# Patient Record
Sex: Male | Born: 1985
Health system: Southern US, Community
[De-identification: ages and names within clinical notes are randomized; demographics above are authoritative.]

---
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
## 2006-01-07 ENCOUNTER — Encounter: Admission: RE | Admit: 2006-01-07 | Discharge: 2006-01-07 | Payer: Self-pay | Admitting: Gastroenterology

## 2007-12-31 ENCOUNTER — Encounter: Admission: RE | Admit: 2007-12-31 | Discharge: 2007-12-31 | Payer: Self-pay | Admitting: Family Medicine

## 2013-07-14 ENCOUNTER — Other Ambulatory Visit: Payer: Self-pay | Admitting: Family Medicine

## 2013-07-14 ENCOUNTER — Ambulatory Visit
Admission: RE | Admit: 2013-07-14 | Discharge: 2013-07-14 | Disposition: A | Discharge status: Home / Self Care | Payer: BC Managed Care – PPO | Source: Ambulatory Visit | Attending: Family Medicine | Admitting: Family Medicine

## 2013-07-14 DIAGNOSIS — M25551 Pain in right hip: Secondary | ICD-10-CM

## 2015-10-06 DIAGNOSIS — Z23 Encounter for immunization: Secondary | ICD-10-CM | POA: Diagnosis not present

## 2016-01-26 IMAGING — CR DG HIP COMPLETE 2+V*R*
2 series · 2 of 2 positions shown · non-contrast
Comparison: None.

CLINICAL DATA: Right hip pain, patient runs

EXAM:
RIGHT HIP - COMPLETE 2+ VIEW

[t hip ap right]
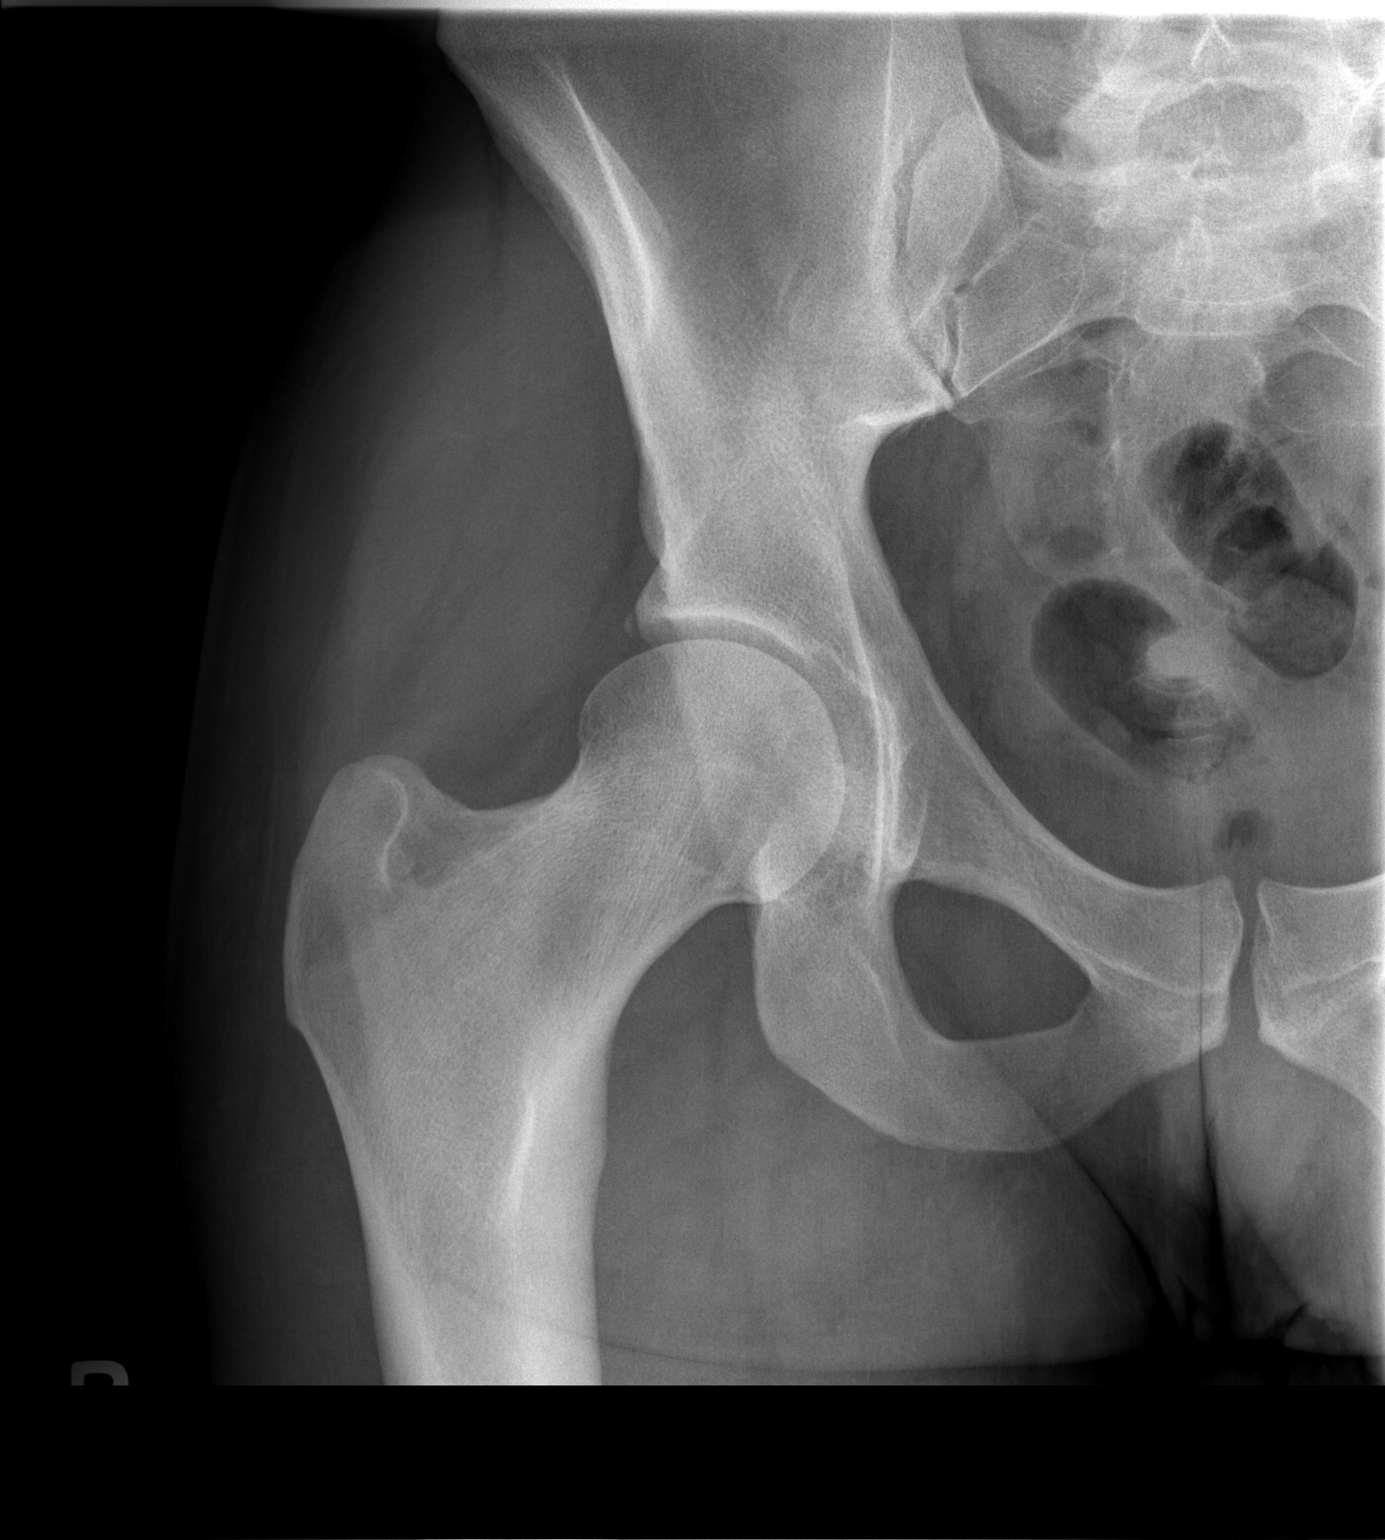

[t hip frog leg right]
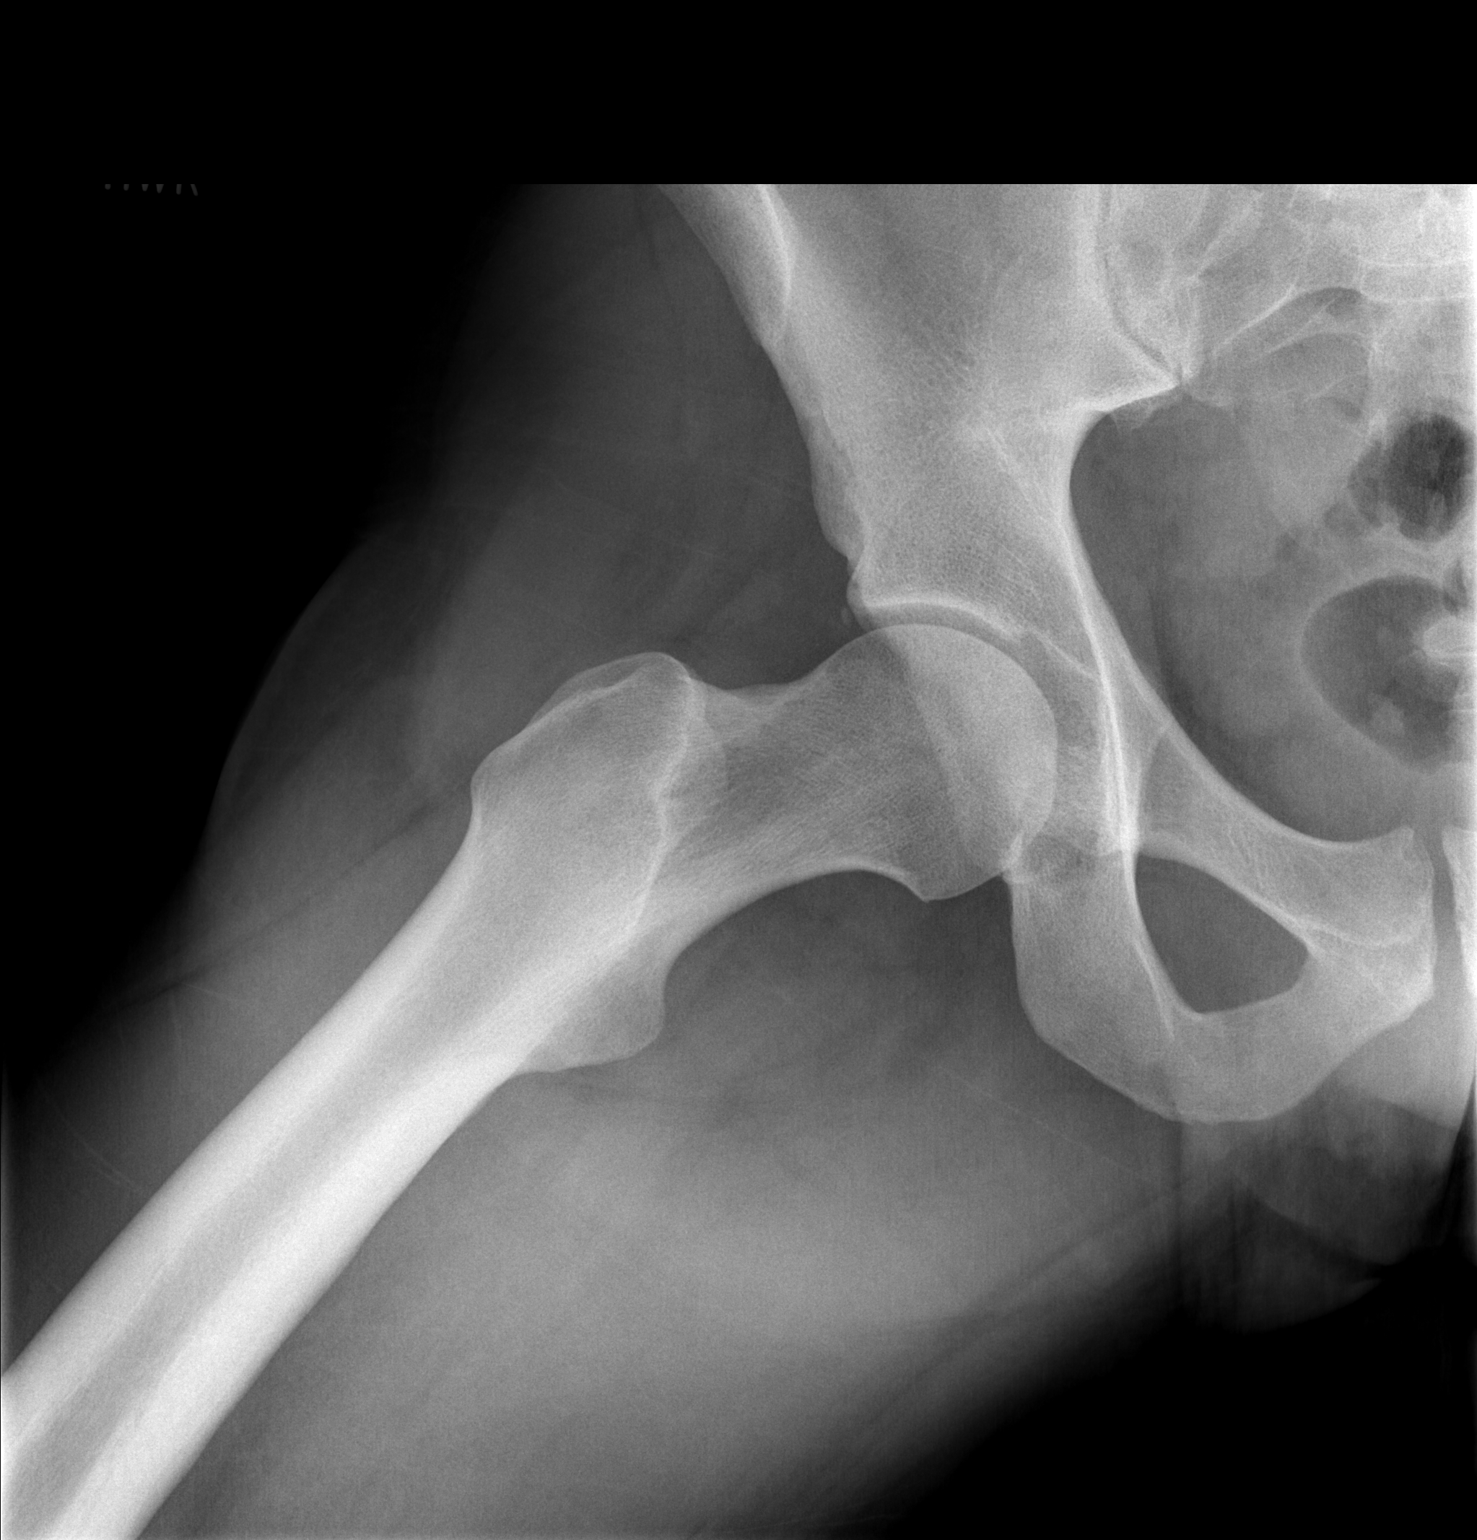

[2 of 2 positions shown; findings below may reference images not displayed]

FINDINGS: The right hip joint space is well preserved. No fracture is seen. No
degenerative change is noted. The right SI joint is corticated. The
right pelvic ramus appears normal.
IMPRESSION: Negative.

## 2016-10-30 DIAGNOSIS — Z23 Encounter for immunization: Secondary | ICD-10-CM | POA: Diagnosis not present

## 2017-03-06 DIAGNOSIS — L700 Acne vulgaris: Secondary | ICD-10-CM | POA: Diagnosis not present

## 2017-04-02 DIAGNOSIS — L0201 Cutaneous abscess of face: Secondary | ICD-10-CM | POA: Diagnosis not present

## 2017-04-11 DIAGNOSIS — L910 Hypertrophic scar: Secondary | ICD-10-CM | POA: Diagnosis not present

## 2017-04-11 DIAGNOSIS — L0291 Cutaneous abscess, unspecified: Secondary | ICD-10-CM | POA: Diagnosis not present

## 2017-11-21 DIAGNOSIS — M25559 Pain in unspecified hip: Secondary | ICD-10-CM | POA: Diagnosis not present

## 2017-11-21 DIAGNOSIS — M25562 Pain in left knee: Secondary | ICD-10-CM | POA: Diagnosis not present

## 2017-11-21 DIAGNOSIS — Z0000 Encounter for general adult medical examination without abnormal findings: Secondary | ICD-10-CM | POA: Diagnosis not present

## 2017-11-26 DIAGNOSIS — M25562 Pain in left knee: Secondary | ICD-10-CM | POA: Diagnosis not present

## 2017-11-26 DIAGNOSIS — M25551 Pain in right hip: Secondary | ICD-10-CM | POA: Diagnosis not present

## 2017-12-04 DIAGNOSIS — Z13220 Encounter for screening for lipoid disorders: Secondary | ICD-10-CM | POA: Diagnosis not present

## 2017-12-04 DIAGNOSIS — Z0000 Encounter for general adult medical examination without abnormal findings: Secondary | ICD-10-CM | POA: Diagnosis not present

## 2017-12-12 DIAGNOSIS — M25551 Pain in right hip: Secondary | ICD-10-CM | POA: Diagnosis not present

## 2017-12-12 DIAGNOSIS — M25562 Pain in left knee: Secondary | ICD-10-CM | POA: Diagnosis not present

## 2018-03-24 DIAGNOSIS — J029 Acute pharyngitis, unspecified: Secondary | ICD-10-CM | POA: Diagnosis not present

## 2018-08-07 DIAGNOSIS — Z20828 Contact with and (suspected) exposure to other viral communicable diseases: Secondary | ICD-10-CM | POA: Diagnosis not present

## 2018-11-20 DIAGNOSIS — Z20828 Contact with and (suspected) exposure to other viral communicable diseases: Secondary | ICD-10-CM | POA: Diagnosis not present

## 2019-08-17 DIAGNOSIS — Z3141 Encounter for fertility testing: Secondary | ICD-10-CM | POA: Diagnosis not present

## 2019-12-23 DIAGNOSIS — Z23 Encounter for immunization: Secondary | ICD-10-CM | POA: Diagnosis not present

## 2019-12-23 DIAGNOSIS — M25572 Pain in left ankle and joints of left foot: Secondary | ICD-10-CM | POA: Diagnosis not present

## 2020-01-08 ENCOUNTER — Other Ambulatory Visit: Payer: Self-pay

## 2020-01-08 ENCOUNTER — Ambulatory Visit: Payer: 59 | Admitting: Podiatry

## 2020-01-08 ENCOUNTER — Ambulatory Visit (INDEPENDENT_AMBULATORY_CARE_PROVIDER_SITE_OTHER): Payer: 59

## 2020-01-08 DIAGNOSIS — M7661 Achilles tendinitis, right leg: Secondary | ICD-10-CM | POA: Diagnosis not present

## 2020-01-08 DIAGNOSIS — M25572 Pain in left ankle and joints of left foot: Secondary | ICD-10-CM

## 2020-01-08 DIAGNOSIS — M25571 Pain in right ankle and joints of right foot: Secondary | ICD-10-CM

## 2020-01-08 MED ORDER — MELOXICAM 15 MG PO TABS: 15.0000 mg | ORAL_TABLET | Freq: Every day | ORAL | 3 refills | Status: AC

## 2020-01-08 NOTE — Patient Instructions (Addendum)
Look for Voltaren gel at the pharmacy over the counter or online (also known as diclofenac 1% gel). Apply to the painful areas 3-4x daily with the supplied dosing card. Allow to dry for 10 minutes before going into socks/shoes  Wear the boot for 2 weeks then gradually come out of it into a supportive sneaker. Do the stretches twice daily. Take the meloxicam daily for 2 weeks and then as needed   Achilles Tendinitis  with Rehab Achilles tendinitis is a disorder of the Achilles tendon. The Achilles tendon connects the large calf muscles (Gastrocnemius and Soleus) to the heel bone (calcaneus). This tendon is sometimes called the heel cord. It is important for pushing-off and standing on your toes and is important for walking, running, or jumping. Tendinitis is often caused by overuse and repetitive microtrauma. SYMPTOMS  Pain, tenderness, swelling, warmth, and redness may occur over the Achilles tendon even at rest.  Pain with pushing off, or flexing or extending the ankle.  Pain that is worsened after or during activity. CAUSES   Overuse sometimes seen with rapid increase in exercise programs or in sports requiring running and jumping.  Poor physical conditioning (strength and flexibility or endurance).  Running sports, especially training running down hills.  Inadequate warm-up before practice or play or failure to stretch before participation.  Injury to the tendon. PREVENTION   Warm up and stretch before practice or competition.  Allow time for adequate rest and recovery between practices and competition.  Keep up conditioning.  Keep up ankle and leg flexibility.  Improve or keep muscle strength and endurance.  Improve cardiovascular fitness.  Use proper technique.  Use proper equipment (shoes, skates).  To help prevent recurrence, taping, protective strapping, or an adhesive bandage may be recommended for several weeks after healing is complete. PROGNOSIS   Recovery  may take weeks to several months to heal.  Longer recovery is expected if symptoms have been prolonged.  Recovery is usually quicker if the inflammation is due to a direct blow as compared with overuse or sudden strain. RELATED COMPLICATIONS   Healing time will be prolonged if the condition is not correctly treated. The injury must be given plenty of time to heal.  Symptoms can reoccur if activity is resumed too soon.  Untreated, tendinitis may increase the risk of tendon rupture requiring additional time for recovery and possibly surgery. TREATMENT   The first treatment consists of rest anti-inflammatory medication, and ice to relieve the pain.  Stretching and strengthening exercises after resolution of pain will likely help reduce the risk of recurrence. Referral to a physical therapist or athletic trainer for further evaluation and treatment may be helpful.  A walking boot or cast may be recommended to rest the Achilles tendon. This can help break the cycle of inflammation and microtrauma.  Arch supports (orthotics) may be prescribed or recommended by your caregiver as an adjunct to therapy and rest.  Surgery to remove the inflamed tendon lining or degenerated tendon tissue is rarely necessary and has shown less than predictable results. MEDICATION   Nonsteroidal anti-inflammatory medications, such as aspirin and ibuprofen, may be used for pain and inflammation relief. Do not take within 7 days before surgery. Take these as directed by your caregiver. Contact your caregiver immediately if any bleeding, stomach upset, or signs of allergic reaction occur. Other minor pain relievers, such as acetaminophen, may also be used.  Pain relievers may be prescribed as necessary by your caregiver. Do not take prescription pain medication for  longer than 4 to 7 days. Use only as directed and only as much as you need.  Cortisone injections are rarely indicated. Cortisone injections may weaken  tendons and predispose to rupture. It is better to give the condition more time to heal than to use them. HEAT AND COLD  Cold is used to relieve pain and reduce inflammation for acute and chronic Achilles tendinitis. Cold should be applied for 10 to 15 minutes every 2 to 3 hours for inflammation and pain and immediately after any activity that aggravates your symptoms. Use ice packs or an ice massage.  Heat may be used before performing stretching and strengthening activities prescribed by your caregiver. Use a heat pack or a warm soak. SEEK MEDICAL CARE IF:  Symptoms get worse or do not improve in 2 weeks despite treatment.  New, unexplained symptoms develop. Drugs used in treatment may produce side effects.  EXERCISES:  RANGE OF MOTION (ROM) AND STRETCHING EXERCISES - Achilles Tendinitis  These exercises may help you when beginning to rehabilitate your injury. Your symptoms may resolve with or without further involvement from your physician, physical therapist or athletic trainer. While completing these exercises, remember:   Restoring tissue flexibility helps normal motion to return to the joints. This allows healthier, less painful movement and activity.  An effective stretch should be held for at least 30 seconds.  A stretch should never be painful. You should only feel a gentle lengthening or release in the stretched tissue.  STRETCH  Gastroc, Standing   Place hands on wall.  Extend right / left leg, keeping the front knee somewhat bent.  Slightly point your toes inward on your back foot.  Keeping your right / left heel on the floor and your knee straight, shift your weight toward the wall, not allowing your back to arch.  You should feel a gentle stretch in the right / left calf. Hold this position for 10 seconds. Repeat 3 times. Complete this stretch 2 times per day.  STRETCH  Soleus, Standing   Place hands on wall.  Extend right / left leg, keeping the other knee  somewhat bent.  Slightly point your toes inward on your back foot.  Keep your right / left heel on the floor, bend your back knee, and slightly shift your weight over the back leg so that you feel a gentle stretch deep in your back calf.  Hold this position for 10 seconds. Repeat 3 times. Complete this stretch 2 times per day.  STRETCH  Gastrocsoleus, Standing  Note: This exercise can place a lot of stress on your foot and ankle. Please complete this exercise only if specifically instructed by your caregiver.   Place the ball of your right / left foot on a step, keeping your other foot firmly on the same step.  Hold on to the wall or a rail for balance.  Slowly lift your other foot, allowing your body weight to press your heel down over the edge of the step.  You should feel a stretch in your right / left calf.  Hold this position for 10 seconds.  Repeat this exercise with a slight bend in your knee. Repeat 3 times. Complete this stretch 2 times per day.   STRENGTHENING EXERCISES - Achilles Tendinitis These exercises may help you when beginning to rehabilitate your injury. They may resolve your symptoms with or without further involvement from your physician, physical therapist or athletic trainer. While completing these exercises, remember:   Muscles can gain  both the endurance and the strength needed for everyday activities through controlled exercises.  Complete these exercises as instructed by your physician, physical therapist or athletic trainer. Progress the resistance and repetitions only as guided.  You may experience muscle soreness or fatigue, but the pain or discomfort you are trying to eliminate should never worsen during these exercises. If this pain does worsen, stop and make certain you are following the directions exactly. If the pain is still present after adjustments, discontinue the exercise until you can discuss the trouble with your clinician.  STRENGTH -  Plantar-flexors   Sit with your right / left leg extended. Holding onto both ends of a rubber exercise band/tubing, loop it around the ball of your foot. Keep a slight tension in the band.  Slowly push your toes away from you, pointing them downward.  Hold this position for 10 seconds. Return slowly, controlling the tension in the band/tubing. Repeat 3 times. Complete this exercise 2 times per day.   STRENGTH - Plantar-flexors   Stand with your feet shoulder width apart. Steady yourself with a wall or table using as little support as needed.  Keeping your weight evenly spread over the width of your feet, rise up on your toes.*  Hold this position for 10 seconds. Repeat 3 times. Complete this exercise 2 times per day.  *If this is too easy, shift your weight toward your right / left leg until you feel challenged. Ultimately, you may be asked to do this exercise with your right / left foot only.  STRENGTH  Plantar-flexors, Eccentric  Note: This exercise can place a lot of stress on your foot and ankle. Please complete this exercise only if specifically instructed by your caregiver.   Place the balls of your feet on a step. With your hands, use only enough support from a wall or rail to keep your balance.  Keep your knees straight and rise up on your toes.  Slowly shift your weight entirely to your right / left toes and pick up your opposite foot. Gently and with controlled movement, lower your weight through your right / left foot so that your heel drops below the level of the step. You will feel a slight stretch in the back of your calf at the end position.  Use the healthy leg to help rise up onto the balls of both feet, then lower weight only on the right / left leg again. Build up to 15 repetitions. Then progress to 3 consecutive sets of 15 repetitions.*  After completing the above exercise, complete the same exercise with a slight knee bend (about 30 degrees). Again, build up to 15  repetitions. Then progress to 3 consecutive sets of 15 repetitions.* Perform this exercise 2 times per day.  *When you easily complete 3 sets of 15, your physician, physical therapist or athletic trainer may advise you to add resistance by wearing a backpack filled with additional weight.  STRENGTH - Plantar Flexors, Seated   Sit on a chair that allows your feet to rest flat on the ground. If necessary, sit at the edge of the chair.  Keeping your toes firmly on the ground, lift your right / left heel as far as you can without increasing any discomfort in your ankle. Repeat 3 times. Complete this exercise 2 times a day.

## 2020-01-11 ENCOUNTER — Encounter: Payer: Self-pay | Admitting: Podiatry

## 2020-01-11 NOTE — Progress Notes (Signed)
  Subjective:  Patient ID: Chad Delgado, male    DOB: 1985/05/08,  MRN: [id number]  Chief Complaint  Patient presents with  . Ankle Pain    Right ankle pain.    35 y.o. male presents with the above complaint. History confirmed with patient. Pain along the back of the heel, present for about 2 weeks, has had swelling. Notes prior hx of fractured metatarsals bilaterally.  Objective:  Physical Exam: warm, good capillary refill, no trophic changes or ulcerative lesions, normal DP and PT pulses and normal sensory exam.   Right Foot: tenderness at Achilles tendon insertion   No images are attached to the encounter.  Radiographs: X-ray of both feet: mild posterior spur on R heel, prior 5th metatarsal fractures Assessment:   1. Bilateral ankle pain, unspecified chronicity      Plan:  Patient was evaluated and treated and all questions answered.  -XR reviewed with patient -Educated on stretching and icing of the affected limb. -Rx for Meloxicam. Advised on risks, benefits, and alternatives of the medication  -Recommended voltaren gel -he has a CAM boot, recommend wearing this for 2 weeks and then back to shoes with gel pad  Return in about 1 month (around 02/08/2020).

## 2020-02-12 ENCOUNTER — Ambulatory Visit: Payer: 59 | Admitting: Podiatry

## 2020-02-15 ENCOUNTER — Other Ambulatory Visit: Payer: Self-pay

## 2020-02-15 ENCOUNTER — Encounter: Payer: Self-pay | Admitting: Podiatry

## 2020-02-15 ENCOUNTER — Ambulatory Visit: Payer: 59 | Admitting: Podiatry

## 2020-02-15 DIAGNOSIS — M7661 Achilles tendinitis, right leg: Secondary | ICD-10-CM

## 2020-02-15 DIAGNOSIS — M79671 Pain in right foot: Secondary | ICD-10-CM | POA: Diagnosis not present

## 2020-02-15 NOTE — Progress Notes (Signed)
  Subjective:  Patient ID: Chad Delgado, male    DOB: 1985-02-26,  MRN: [id number]  Chief Complaint  Patient presents with  . Foot Pain    Right foot pain. PT stated that there has been no change he stated that the pain is not worse its just not any better.    35 y.o. male returns with the above complaint. History confirmed with patient.  Pain is about the same.  Objective:  Physical Exam: warm, good capillary refill, no trophic changes or ulcerative lesions, normal DP and PT pulses and normal sensory exam.   Right Foot: tenderness at Achilles tendon insertion   No images are attached to the encounter.  Radiographs: X-ray of both feet: mild posterior spur on R heel, prior 5th metatarsal fractures Assessment:   1. Achilles tendinitis of right lower extremity   2. Pain of right heel      Plan:  Patient was evaluated and treated and all questions answered.  -XR reviewed with patient -Educated on stretching and icing of the affected limb.  I think this point would be benefit greatly from formal physical therapy.  Referral sent to [facility] in [place] he will be in this. -Continue use of meloxicam. Advised on risks, benefits, and alternatives of the medication  -Recommended voltaren gel again, he has not been using this consistently -he did not have his own CAM boot at home, I do not think it would be to see her at this point to immobilize him  No follow-ups on file.

## 2020-03-28 ENCOUNTER — Ambulatory Visit: Payer: 59 | Admitting: Podiatry

## 2020-03-28 ENCOUNTER — Encounter: Payer: Self-pay | Admitting: Podiatry

## 2020-03-28 ENCOUNTER — Other Ambulatory Visit: Payer: Self-pay

## 2020-03-28 DIAGNOSIS — M7661 Achilles tendinitis, right leg: Secondary | ICD-10-CM | POA: Diagnosis not present

## 2020-03-28 NOTE — Progress Notes (Signed)
  Subjective:  Patient ID: Chad Delgado, male    DOB: 1985/05/22,  MRN: [id number]  Chief Complaint  Patient presents with  . Foot Pain    PT stated that he is doing well stated that physical therapy has been great he has no concerns at this time.    34 y.o. male returns with the above complaint. History confirmed with patient.  Doing quite well therapy helped considerably.  He had to  stop meloxicam secondary to duodenal ulcers which he has a history of Objective:  Physical Exam: warm, good capillary refill, no trophic changes or ulcerative lesions, normal DP and PT pulses and normal sensory exam.   Right Foot: Today he has minimal tenderness at Achilles tendon insertion   No images are attached to the encounter.  Radiographs: X-ray of both feet: mild posterior spur on R heel, prior 5th metatarsal fractures Assessment:   1. Achilles tendinitis of right lower extremity      Plan:  Patient was evaluated and treated and all questions answered.  -Doing quite well, he is due to finish his physical therapy this week.  I advised him to continue his home exercise plan until he is completely pain-free for 2 weeks.  Gradually increase activity. Return if symptoms worsen or fail to improve.

## 2021-11-16 ENCOUNTER — Institutional Professional Consult (permissible substitution): Payer: Self-pay | Admitting: Pulmonary Disease

## 2021-11-27 ENCOUNTER — Ambulatory Visit (INDEPENDENT_AMBULATORY_CARE_PROVIDER_SITE_OTHER): Payer: 59 | Admitting: Pulmonary Disease

## 2021-11-27 ENCOUNTER — Encounter: Payer: Self-pay | Admitting: Pulmonary Disease

## 2021-11-27 VITALS — BP 120/62 | HR 56 | Temp 98.5°F | Ht 69.0 in | Wt 178.5 lb

## 2021-11-27 DIAGNOSIS — J4540 Moderate persistent asthma, uncomplicated: Secondary | ICD-10-CM

## 2021-11-27 DIAGNOSIS — R0609 Other forms of dyspnea: Secondary | ICD-10-CM | POA: Diagnosis not present

## 2021-11-27 MED ORDER — BUDESONIDE-FORMOTEROL FUMARATE 160-4.5 MCG/ACT IN AERO: 2.0000 | INHALATION_SPRAY | Freq: Two times a day (BID) | RESPIRATORY_TRACT | 12 refills | Status: DC

## 2021-11-27 NOTE — Patient Instructions (Addendum)
Nice to see you today!  Let's try Symbicort 2 puffs twice a day - rinse mouth after every use  Use albuterol as needed  Return to clinic in 3 months or sooner as needed

## 2021-11-27 NOTE — Progress Notes (Signed)
@Patient  ID: , male    DOB: 09/12/1985, 36 y.o.   MRN: [id number]  Chief Complaint  Patient presents with   Consult    Consult for asthma. Pt states over the last few weeks he has had worsening SOB and standard asthma symptoms. Pt states he is not on any daily inhalers.     Referring provider: [id number], PA-C  HPI:   36 y.o. man whom are seen in consultation for evaluation of asthma.  Note from referring provider reviewed.  Patient was in usual health.  Over the last 3 to 4 months developed worsening wheeze, shortness of breath with exercise.  Regardless of type of exercise.  Typical of past asthma symptoms.  He is albuterol more frequently as needed.  Helped some.  Was given a course of prednisone.  This markedly improved symptoms although he still has some lingering symptoms, particularly occasional wheeze and exercise intolerance.  Not using maintenance inhaler currently.  Most recent chest x-ray 12/21/2007 reviewed interpreted as clear lungs with mild hyperinflation of the lateral view.  PMH: Asthma, seasonal allergies Surgical history:History reviewed. No pertinent surgical history. Family history: Denies significant rest or illness in first relatives Social history: Never smoker, from [place], works at [employer] / [employer]:   ACT:      No data to display          MMRC:     No data to display          Epworth:      No data to display          Tests:   FENO:  No results found for: "NITRICOXIDE"  PFT:     No data to display          WALK:      No data to display          Imaging: Personally reviewed and as per EMR and discussion in this note No results found.  Lab Results:  CBC No results found for: "WBC", "RBC", "HGB", "HCT", "PLT", "MCV", "MCH", "MCHC", "RDW", "LYMPHSABS", "MONOABS", "EOSABS", "BASOSABS"  BMET No results found for: "NA", "K", "CL", "CO2", "GLUCOSE", "BUN",  "CREATININE", "CALCIUM", "GFRNONAA", "GFRAA"  BNP No results found for: "BNP"  ProBNP No results found for: "PROBNP"  Specialty Problems   None   No Known Allergies   There is no immunization history on file for this patient.  History reviewed. No pertinent past medical history.  Tobacco History: Social History   Tobacco Use  Smoking Status Never   Passive exposure: Never  Smokeless Tobacco Never   Counseling given: Not Answered   Continue to not smoke  Outpatient Encounter Medications as of 11/27/2021  Medication Sig   albuterol (VENTOLIN HFA) 108 (90 Base) MCG/ACT inhaler Inhale 1-2 puffs into the lungs every 4 (four) hours as needed.   budesonide-formoterol (SYMBICORT) 160-4.5 MCG/ACT inhaler Inhale 2 puffs into the lungs in the morning and at bedtime.   meloxicam (MOBIC) 15 MG tablet Take 1 tablet (15 mg total) by mouth daily.   minocycline (MINOCIN) 50 MG capsule minocycline 50 mg capsule  TK ONE C PO BID   [DISCONTINUED] influenza vac split quadrivalent PF (AFLURIA QUADRIVALENT) 0.5 ML injection Afluria Qd 2019-20 (36 mos up)(PF)60 mcg (15 mcg x4)/0.5 mL IM syringe  ADM 0.5ML IM UTD   No facility-administered encounter medications on file as of 11/27/2021.     Review of Systems  Review of Systems  No chest  pain with exertion.  No orthopnea or PND.  Comprehensive review of systems otherwise negative. Physical Exam  BP 120/62 (BP Location: Left Arm, Patient Position: Sitting, Cuff Size: Normal)   Pulse (!) 56   Temp 98.5 F (36.9 C) (Oral)   Ht 5\' 9"  (1.753 m)   Wt 178 lb 8 oz (81 kg)   SpO2 99%   BMI 26.36 kg/m   Wt Readings from Last 5 Encounters:  11/27/21 178 lb 8 oz (81 kg)    BMI Readings from Last 5 Encounters:  11/27/21 26.36 kg/m     Physical Exam General: Well-appearing, no acute distress Eyes: EOMI, no icterus Neck: Supple, no JVP Pulmonary: Clear, normal work of breathing Cardiovascular: Warm, no edema Abdomen:  Nondistended, sounds present MSK: No synovitis, no joint effusion Neuro: Normal gait, no weakness Psych: Normal mood, full affect  Assessment & Plan:   Moderate persistent asthma: Worsening symptoms of wheeze, exercise intolerance over the last several months.  Marked improvement after 1 round of prednisone.  Using albuterol more frequently. Start maintenance inhaler, high-dose Symbicort once daily.  Consider de-escalation at next visit if doing well.  Dyspnea on exertion, wheeze: Suspect related to asthma as above.  Exam is clear.  If not improving consider chest imaging.   Return in about 3 months (around 02/27/2022).   03/01/2022, MD 11/27/2021

## 2021-11-29 ENCOUNTER — Telehealth: Payer: Self-pay | Admitting: Pulmonary Disease

## 2021-11-29 NOTE — Telephone Encounter (Signed)
Attempted to call pt but unable to reach. Left message for him to return call. °

## 2021-11-29 NOTE — Telephone Encounter (Signed)
Patient states he missed the call from the nurse and would like a call back.  CB# [phone]

## 2021-12-01 NOTE — Telephone Encounter (Signed)
Called and spoke with patient. He stated that he was prescribed Symbicort during his last OV and it was too expensive. He wanted to know if something could be prescribed for him that would cheaper. I explained to him that we could send a message to the pharmacy team to check on this, he verbalized understanding.   Dr. Judeth Horn, would you like for the pharmacy to check for coverage for similar inhalers to Symbicort? Or would you like to check a different class?

## 2021-12-01 NOTE — Telephone Encounter (Signed)
Can we check to see most cost effective ICS/LABA therapy?

## 2021-12-04 ENCOUNTER — Other Ambulatory Visit (HOSPITAL_COMMUNITY): Payer: Self-pay

## 2021-12-04 MED ORDER — FLUTICASONE-SALMETEROL 230-21 MCG/ACT IN AERO: 2.0000 | INHALATION_SPRAY | Freq: Two times a day (BID) | RESPIRATORY_TRACT | 12 refills | Status: AC

## 2021-12-04 NOTE — Telephone Encounter (Signed)
Per benefits investigation the least expensive ICS/LABA at this time is Brand Advair HFA for a $15.00 co-pay/30 days.

## 2021-12-04 NOTE — Telephone Encounter (Signed)
Called and spoke to patient and went over medication from Dr Judeth Horn. Patient agreed to new inhaler. Verified pharmacy with patient over the phone. Nothing further needed

## 2021-12-04 NOTE — Telephone Encounter (Signed)
Please send Advair HFA 220 dose 2 puff BID to local pharmacy - thanks!

## 2022-03-01 ENCOUNTER — Ambulatory Visit: Payer: 59 | Admitting: Pulmonary Disease

## 2022-03-01 ENCOUNTER — Encounter: Payer: Self-pay | Admitting: Pulmonary Disease

## 2022-03-01 VITALS — BP 118/64 | HR 71 | Wt 178.6 lb

## 2022-03-01 DIAGNOSIS — J4540 Moderate persistent asthma, uncomplicated: Secondary | ICD-10-CM | POA: Diagnosis not present

## 2022-03-01 NOTE — Progress Notes (Signed)
$'@Patient'y$  ID: Chad Delgado, male    DOB: [birth year], 37 y.o.   MRN: [id number]  Chief Complaint  Patient presents with   Follow-up    Pt is here for follow up for asthma. He states he was only able to get a 2 month supply and his phamracy was not letting him get anymore. In decemebr is sent 12 refills in. He states he has been without adviar for about 1 month now and is not seeing a difference.     Referring provider: Marda Stalker, PA-C  HPI:   37 y.o. man whom are seen in follow up for evaluation of asthma.    Overall doing well.  Agreed on maintenance inhaler at the last visit given worsening exercise intolerance.  Will reason the last couple months he has not had access to this.  Regardless, he is doing okay.  Exercising well.  Not running yet due to cold weather.  But overall doing fine exercising despite being off maintenance therapy.  He hopes to resume running in the coming weeks.  We discussed role and rationale for staying off medications for now he is okay with that.  If symptoms are worsening with running then we could resume a maintenance inhaler.  Will need to explore pharmacy benefits given change of the new year.   HPI at initial visit: Patient was in usual health.  Over the last 3 to 4 months developed worsening wheeze, shortness of breath with exercise.  Regardless of type of exercise.  Typical of past asthma symptoms.  He is albuterol more frequently as needed.  Helped some.  Was given a course of prednisone.  This markedly improved symptoms although he still has some lingering symptoms, particularly occasional wheeze and exercise intolerance.  Not using maintenance inhaler currently.  Most recent chest x-ray 12/21/2007 reviewed interpreted as clear lungs with mild hyperinflation of the lateral view.  PMH: Asthma, seasonal allergies Surgical history:History reviewed. No pertinent surgical history. Family history: Denies significant rest or illness in first  relatives Social history: Never smoker, from [place], works at [employer] / Pulmonary Flowsheets:   ACT:      No data to display           MMRC:     No data to display           Epworth:      No data to display           Tests:   FENO:  No results found for: "NITRICOXIDE"  PFT:     No data to display           WALK:      No data to display           Imaging: Personally reviewed and as per EMR and discussion in this note No results found.  Lab Results:  CBC No results found for: "WBC", "RBC", "HGB", "HCT", "PLT", "MCV", "MCH", "MCHC", "RDW", "LYMPHSABS", "MONOABS", "EOSABS", "BASOSABS"  BMET No results found for: "NA", "K", "CL", "CO2", "GLUCOSE", "BUN", "CREATININE", "CALCIUM", "GFRNONAA", "GFRAA"  BNP No results found for: "BNP"  ProBNP No results found for: "PROBNP"  Specialty Problems   None   No Known Allergies   There is no immunization history on file for this patient.  History reviewed. No pertinent past medical history.  Tobacco History: Social History   Tobacco Use  Smoking Status Never   Passive exposure: Never  Smokeless Tobacco Never   Counseling given: Not Answered  Continue to not smoke  Outpatient Encounter Medications as of 03/01/2022  Medication Sig   albuterol (VENTOLIN HFA) 108 (90 Base) MCG/ACT inhaler Inhale 1-2 puffs into the lungs every 4 (four) hours as needed.   fluticasone (FLONASE) 50 MCG/ACT nasal spray Place into both nostrils.   meloxicam (MOBIC) 15 MG tablet Take 1 tablet (15 mg total) by mouth daily.   montelukast (SINGULAIR) 10 MG tablet Take 10 mg by mouth daily as needed.   [DISCONTINUED] minocycline (MINOCIN) 50 MG capsule minocycline 50 mg capsule  TK ONE C PO BID   fluticasone-salmeterol (ADVAIR HFA) 230-21 MCG/ACT inhaler Inhale 2 puffs into the lungs 2 (two) times daily. (Patient not taking: Reported on 03/01/2022)   No facility-administered encounter  medications on file as of 03/01/2022.     Review of Systems  Review of Systems  No chest pain with exertion.  No orthopnea or PND.  Comprehensive review of systems otherwise negative. Physical Exam  BP 118/64 (BP Location: Left Arm, Patient Position: Sitting, Cuff Size: Normal)   Pulse 71   Wt 178 lb 9.6 oz (81 kg)   SpO2 100%   BMI 26.37 kg/m   Wt Readings from Last 5 Encounters:  03/01/22 178 lb 9.6 oz (81 kg)  11/27/21 178 lb 8 oz (81 kg)    BMI Readings from Last 5 Encounters:  03/01/22 26.37 kg/m  11/27/21 26.36 kg/m     Physical Exam General: Well-appearing, no acute distress Eyes: EOMI, no icterus Neck: Supple, no JVP Pulmonary: Clear, normal work of breathing Cardiovascular: Warm, no edema Abdomen: Nondistended, sounds present MSK: No synovitis, no joint effusion Neuro: Normal gait, no weakness Psych: Normal mood, full affect  Assessment & Plan:   Moderate persistent asthma: Worsening symptoms of wheeze, exercise intolerance over the last several months.  Marked improvement after 1 round of prednisone.  Short time of maintenance inhaler now improved.  Okay to self mains on higher.  If he finds exercise difficult with running will need to resume.  Most recently prescribed Advair HFA high-dose.  Dyspnea on exertion, wheeze: Suspect related to asthma as above.  Exam is clear.  Improve relatively spontaneously.  See above for additional details regarding inhalers in the future.   Return if symptoms worsen or fail to improve.   Lanier Clam, MD 03/01/2022

## 2022-03-01 NOTE — Patient Instructions (Signed)
Glad you are doing well  Okay to stay off inhalers  To get back in the running if you find this difficult or challenging let me know we can try again for a maintenance inhaler  Return to clinic as needed

## 2022-03-26 ENCOUNTER — Telehealth: Payer: Self-pay | Admitting: Pulmonary Disease

## 2022-03-26 NOTE — Telephone Encounter (Signed)
Can we run a ticket on patients insurance to see what else is covered besides Advair  Please route message back to triage

## 2022-03-26 NOTE — Telephone Encounter (Signed)
Patient states Advair not covered by insurance. Pharmacy is [facility] [address] Patient phone number is [phone].

## 2022-03-28 NOTE — Telephone Encounter (Signed)
Patient checking on message for inhaler. Patient out of medication. Going out of town Friday. Patient phone number is [phone].

## 2022-03-29 ENCOUNTER — Other Ambulatory Visit (HOSPITAL_COMMUNITY): Payer: Self-pay

## 2022-03-29 NOTE — Telephone Encounter (Signed)
Per test claim on Advair, insurance is stating that refills are not covered and the patient is to call the number on the back of their card. I'm going to assume this is because they want them to change to mail order, but patient will have to be the one to call and tell them if they do or do not want to do that. Once this is resolved, refills should be fine if my knowledge from my retail days is correct.  Other ICS+LABA options are resulting with the following co-pays if there are any issues:  Breo Ellipta $50.00 Dulera $65.53 Symbicort requires a prior [place]

## 2022-04-04 NOTE — Telephone Encounter (Signed)
Called and spoke with pt. Pt verbalized understanding. Pt states he will give [place] a call back after speaking to his insurance, if he decided he wants to try out a alternative.

## 2022-04-06 NOTE — Telephone Encounter (Signed)
Pt called the office about inhalers and said that he would like to switch to [facility] as it was the cheapest that was covered with his insurance.  Dr. Judeth Horn, please advise if you are okay with this switch being done.

## 2022-04-09 MED ORDER — FLUTICASONE FUROATE-VILANTEROL 100-25 MCG/ACT IN AEPB: 1.0000 | INHALATION_SPRAY | Freq: Every day | RESPIRATORY_TRACT | 3 refills | Status: DC

## 2022-04-09 NOTE — Telephone Encounter (Signed)
I called and spoke with the pt  He had started having some symptoms and that is when he tried to get the advair filled  I explained to him that we need to change to Breo 100 per ins  He verbalized understanding  I have placed order for Breo to the preferred pharm  Nothing further needed

## 2022-04-09 NOTE — Telephone Encounter (Signed)
Ok with Breo 100 mcg dose one puff daily, refills 3  April - can you verify that he resumed the inhalers? At last visit he had stopped. Assuming he restarted taking Advair due to worsening symptoms, please send Breo as above and request f/u visit w/ me in 3 months. Thanks!

## 2022-04-09 NOTE — Addendum Note (Signed)
Addended by: Christen Butter on: 04/09/2022 02:00 PM   Modules accepted: Orders

## 2022-06-05 ENCOUNTER — Telehealth: Payer: Self-pay | Admitting: Pulmonary Disease

## 2022-06-05 NOTE — Telephone Encounter (Signed)
Needs a Breo refill. Pharm said he needs to contact us.  [facility] [address]

## 2022-06-06 ENCOUNTER — Other Ambulatory Visit: Payer: Self-pay

## 2022-06-06 MED ORDER — FLUTICASONE FUROATE-VILANTEROL 100-25 MCG/ACT IN AEPB: 1.0000 | INHALATION_SPRAY | Freq: Every day | RESPIRATORY_TRACT | 0 refills | Status: DC

## 2022-06-06 NOTE — Telephone Encounter (Signed)
Spoke with patient. He has requested a 90 day supply of Breo per insurance. Prescription has been sent. NFN

## 2022-06-06 NOTE — Telephone Encounter (Signed)
Spoke with patient he is requesting a 90 day supply for Breo inhaler per insurance. I verified pharmacy-prescription has been sent. NFN

## 2022-08-29 ENCOUNTER — Ambulatory Visit
Admission: RE | Admit: 2022-08-29 | Discharge: 2022-08-29 | Disposition: A | Discharge status: Home / Self Care | Payer: 59 | Source: Ambulatory Visit | Attending: Internal Medicine | Admitting: Internal Medicine

## 2022-08-29 ENCOUNTER — Other Ambulatory Visit: Payer: Self-pay | Admitting: Internal Medicine

## 2022-08-29 DIAGNOSIS — M25571 Pain in right ankle and joints of right foot: Secondary | ICD-10-CM

## 2022-09-01 ENCOUNTER — Other Ambulatory Visit: Payer: Self-pay | Admitting: Pulmonary Disease

## 2023-08-31 ENCOUNTER — Other Ambulatory Visit: Payer: Self-pay | Admitting: Pulmonary Disease
